# Patient Record
Sex: Female | Born: 1975 | Race: White | Hispanic: No | Marital: Single | Smoking: Former smoker
Health system: Southern US, Community
[De-identification: ages and names within clinical notes are randomized; demographics above are authoritative.]

## PROBLEM LIST (undated history)

## (undated) DIAGNOSIS — B3731 Acute candidiasis of vulva and vagina: Secondary | ICD-10-CM

## (undated) DIAGNOSIS — B373 Candidiasis of vulva and vagina: Secondary | ICD-10-CM

## (undated) HISTORY — PX: NO PAST SURGERIES: SHX2092

---

## 2006-04-14 ENCOUNTER — Emergency Department (HOSPITAL_COMMUNITY): Admission: EM | Admit: 2006-04-14 | Discharge: 2006-04-14 | Payer: Self-pay | Admitting: Family Medicine

## 2007-12-07 ENCOUNTER — Emergency Department (HOSPITAL_COMMUNITY): Admission: EM | Admit: 2007-12-07 | Discharge: 2007-12-07 | Payer: Self-pay | Admitting: Emergency Medicine

## 2010-08-03 ENCOUNTER — Emergency Department (HOSPITAL_COMMUNITY): Admission: EM | Admit: 2010-08-03 | Discharge: 2010-08-03 | Payer: Self-pay | Admitting: Emergency Medicine

## 2011-05-03 ENCOUNTER — Inpatient Hospital Stay (INDEPENDENT_AMBULATORY_CARE_PROVIDER_SITE_OTHER)
Admission: RE | Admit: 2011-05-03 | Discharge: 2011-05-03 | Disposition: A | Discharge status: Home / Self Care | Payer: PRIVATE HEALTH INSURANCE | Source: Ambulatory Visit | Attending: Family Medicine | Admitting: Family Medicine

## 2011-05-03 DIAGNOSIS — J019 Acute sinusitis, unspecified: Secondary | ICD-10-CM

## 2012-01-12 ENCOUNTER — Ambulatory Visit (INDEPENDENT_AMBULATORY_CARE_PROVIDER_SITE_OTHER): Payer: PRIVATE HEALTH INSURANCE

## 2012-01-12 DIAGNOSIS — J019 Acute sinusitis, unspecified: Secondary | ICD-10-CM

## 2012-01-12 DIAGNOSIS — R509 Fever, unspecified: Secondary | ICD-10-CM

## 2012-01-20 ENCOUNTER — Emergency Department (INDEPENDENT_AMBULATORY_CARE_PROVIDER_SITE_OTHER)
Admission: EM | Admit: 2012-01-20 | Discharge: 2012-01-20 | Disposition: A | Discharge status: Home / Self Care | Payer: PRIVATE HEALTH INSURANCE | Source: Home / Self Care | Attending: Emergency Medicine | Admitting: Emergency Medicine

## 2012-01-20 ENCOUNTER — Ambulatory Visit: Payer: PRIVATE HEALTH INSURANCE

## 2012-01-20 ENCOUNTER — Encounter (HOSPITAL_COMMUNITY): Payer: Self-pay | Admitting: *Deleted

## 2012-01-20 DIAGNOSIS — J039 Acute tonsillitis, unspecified: Secondary | ICD-10-CM

## 2012-01-20 LAB — POCT RAPID STREP A: Streptococcus, Group A Screen (Direct): NEGATIVE

## 2012-01-20 MED ORDER — PENICILLIN G BENZATHINE 1200000 UNIT/2ML IM SUSP
1.2000 10*6.[IU] | Freq: Once | INTRAMUSCULAR | Status: AC
  Administered 2012-01-20: 1.2 10*6.[IU] via INTRAMUSCULAR

## 2012-01-20 MED ORDER — PENICILLIN G BENZATHINE 1200000 UNIT/2ML IM SUSP
INTRAMUSCULAR | Status: AC
  Filled 2012-01-20: qty 2

## 2012-01-20 NOTE — ED Notes (Signed)
PT  REPORTS  SYMPTOMS  OF  SORETHROAT  AS  WELL  AS  SENSATION OF  SWELLING    -   SHE     WAS  RECENTLY  SEEN  FOR  POSSIBLE  SINUS  INFECTION AND  IS  TAKING  CEFDINIR    SHE  APPEARS  IN NO  SEVERE  DISTRESS  AND  IS   SPEAKING IN COMPLETE  SENTANCES

## 2012-01-20 NOTE — ED Provider Notes (Signed)
Chief Complaint  Patient presents with  . Sore Throat    History of Present Illness:  The patient is a 36 year old female who has had a two-day history of sore throat, nasal congestion, and a slight dry cough. She had noted some spots of white pus on her right tonsil. She denies any fever, chills, or sweats. She's had no cervical lymphadenopathy, swelling, or pain. She is on a course of Omnicef or sinus infection.  Review of Systems:  Other than noted above, the patient denies any of the following symptoms. Systemic:  No fever, chills, sweats, fatigue, myalgias, headache, or anorexia. Eye:  No redness, pain or drainage. ENT:  No earache, nasal congestion, rhinorrhea, sinus pressure, or sore throat. Lungs:  No cough, sputum production, wheezing, shortness of breath. Or chest pain. GI:  No nausea, vomiting, abdominal pain or diarrhea. Skin:  No rash or itching.  PMFSH:  Past medical history, family history, social history, meds, and allergies were reviewed.  Physical Exam:   Vital signs:  BP 120/62  Pulse 80  Temp(Src) 98.5 F (36.9 C) (Oral)  Resp 18  SpO2 100% General:  Alert, in no distress. Eye:  No conjunctival injection or drainage. ENT:  TMs and canals were normal, without erythema or inflammation.  Nasal mucosa was clear and uncongested, without drainage.  Mucous membranes were moist.  The right tonsil was enlarged, red, and had spots of whitish exudate.  There were no oral ulcerations or lesions. Neck:  Supple, no adenopathy, tenderness or mass. Lungs:  No respiratory distress.  Lungs were clear to auscultation, without wheezes, rales or rhonchi.  Breath sounds were clear and equal bilaterally. Heart:  Regular rhythm, without gallops, murmers or rubs. Skin:  Clear, warm, and dry, without rash or lesions.  Labs:   Results for orders placed during the hospital encounter of 01/20/12  POCT RAPID STREP A ([facility] ONLY)      Component Value Range   Streptococcus, Group A  Screen (Direct) NEGATIVE  NEGATIVE      Radiology:  No results found.  Medications given in UCC:  She was given LA Bicillin 1.2 milliunits IM.  Assessment:   Diagnoses that have been ruled out:  None  Diagnoses that are still under consideration:  None  Final diagnoses:  Tonsillitis      Plan:   1.  The following meds were prescribed:   New Prescriptions   No medications on file   2.  The patient was instructed in symptomatic care and handouts were given. 3.  The patient was told to return if becoming worse in any way, if no better in 3 or 4 days, and given some red flag symptoms that would indicate earlier return.   Roque Lias, MD 01/20/12 [phone]

## 2012-01-27 ENCOUNTER — Emergency Department (INDEPENDENT_AMBULATORY_CARE_PROVIDER_SITE_OTHER)
Admission: EM | Admit: 2012-01-27 | Discharge: 2012-01-27 | Disposition: A | Discharge status: Home / Self Care | Payer: PRIVATE HEALTH INSURANCE | Source: Home / Self Care | Attending: Emergency Medicine | Admitting: Emergency Medicine

## 2012-01-27 ENCOUNTER — Encounter (HOSPITAL_COMMUNITY): Payer: Self-pay | Admitting: Emergency Medicine

## 2012-01-27 DIAGNOSIS — J029 Acute pharyngitis, unspecified: Secondary | ICD-10-CM

## 2012-01-27 HISTORY — DX: Acute candidiasis of vulva and vagina: B37.31

## 2012-01-27 HISTORY — DX: Candidiasis of vulva and vagina: B37.3

## 2012-01-27 MED ORDER — FIRST-DUKES MOUTHWASH MT SUSP: 10.0000 mL | Freq: Four times a day (QID) | OROMUCOSAL | Status: DC | PRN

## 2012-01-27 NOTE — ED Notes (Signed)
PT HERE WITH SORE THROAT AND SWELLING S/P SINUS INFECTION AND ATB TREATMENT.PT WAS SEEN ON 01/20/12 FOR SX AND GIVEN PCN INJECTION WHICH HELPED BUT PT STATES THE PAIN AND SWELLING RESTARTED.NO FEVER,N,V, WITH SX.PT HAS TAKEN IBUPROFEN BUT NOT WORKING.NO S/O DISTRESS OR DIFF SWALLOWING

## 2012-01-27 NOTE — ED Provider Notes (Signed)
History     CSN: [id number]  Arrival date & time 01/27/12  1047   None     Chief Complaint  Patient presents with  . Sore Throat    (Consider location/radiation/quality/duration/timing/severity/associated sxs/prior treatment) HPI Comments: Patient with sore throat, cervical lymphadenopathy starting last night. No nausea, vomiting, fevers, rhinorrhea, voice changes, trismus, cough. No abdominal pain, rash. Patient with similar symptoms approximately a week ago-was seen in the urgent care Center on 01/20/2012 for sore throat, was on Omnicef for sinus infection at that time. Was noted to have enlarged right tonsil with exudate. Rapid strep was negative. Got a shot of Bicillin. Patient states she got completely better, however, symptoms started again last night. Patient states she gets frequent yeast infections, and has just finished 2 courses of antibiotics, and wonders if this could possibly be a yeast infection of her throat.  ROS as noted in HPI. All other ROS negative.   Patient is a 36 y.o. female presenting with pharyngitis. The history is provided by the patient. No language interpreter was used.  Sore Throat This is a recurrent problem. The current episode started yesterday. The problem occurs constantly. Pertinent negatives include no chest pain, no abdominal pain, no headaches and no shortness of breath. The symptoms are aggravated by swallowing. The symptoms are relieved by nothing. She has tried nothing for the symptoms.    Past Medical History  Diagnosis Date  . Yeast vaginitis     History reviewed. No pertinent past surgical history.  History reviewed. No pertinent family history.  History  Substance Use Topics  . Smoking status: Never Smoker   . Smokeless tobacco: Not on file  . Alcohol Use: Yes    OB History    Grav Para Term Preterm Abortions TAB SAB Ect Mult Living                  Review of Systems  Respiratory: Negative for shortness of breath.     Cardiovascular: Negative for chest pain.  Gastrointestinal: Negative for abdominal pain.  Neurological: Negative for headaches.    Allergies  Review of patient's allergies indicates no known allergies.  Home Medications   Current Outpatient Rx  Name Route Sig Dispense Refill  . FIRST-DUKES MOUTHWASH MT SUSP Mouth/Throat Use as directed 10 mLs in the mouth or throat 4 (four) times daily as needed. Swish and swallow x 1 week 237 mL 0    BP 121/78  Pulse 60  Temp(Src) 97.4 F (36.3 C) (Oral)  Resp 16  SpO2 100%  LMP 01/27/2012  Physical Exam  Nursing note and vitals reviewed. Constitutional: She is oriented to person, place, and time. She appears well-developed and well-nourished.  HENT:  Head: Normocephalic and atraumatic.  Right Ear: Tympanic membrane and ear canal normal.  Left Ear: Tympanic membrane and ear canal normal.  Nose: Nose normal.  Mouth/Throat: Uvula is midline and mucous membranes are normal. Posterior oropharyngeal erythema present. No oropharyngeal exudate.       Mildly enlarged tonsils bilaterally. Scant exudates.  Eyes: Conjunctivae and EOM are normal. Pupils are equal, round, and reactive to light.  Neck: Normal range of motion. Neck supple.  Cardiovascular: Normal rate, regular rhythm and normal heart sounds.   Pulmonary/Chest: Effort normal and breath sounds normal.  Abdominal: Soft. Bowel sounds are normal. She exhibits no distension. There is no splenomegaly. There is no tenderness. There is no rebound and no guarding.  Musculoskeletal: Normal range of motion.  Lymphadenopathy:    She has  cervical adenopathy.  Neurological: She is alert and oriented to person, place, and time.  Skin: Skin is warm and dry. No rash noted.  Psychiatric: She has a normal mood and affect. Her behavior is normal. Judgment and thought content normal.    ED Course  Procedures (including critical care time)   Labs Reviewed  POCT RAPID STREP A ([facility] ONLY)  POCT  INFECTIOUS MONO SCREEN  STREP A DNA PROBE   No results found.   1. Pharyngitis       MDM   previous charts, labs reviewed. As noted in history of present illness  Patient's pharynx erythematous, irritated. Mildly enlarged tonsils with exudates. Positive cervical lymphadenopathy. Patient refused mono screen. Strep negative. Will send off throat culture to guide antibiotic treatment if needed. Given the history of 2 courses of antibiotics, may have yeast esophagitis. No oral thrush apparent on exam. Will treat symptoms with Dukes Magic mouthwash. Patient's to followup with PMD of choice prn.  Luiz Blare, MD 01/27/12 [phone]

## 2012-01-28 LAB — STREP A DNA PROBE: Special Requests: NORMAL

## 2013-07-31 LAB — OB RESULTS CONSOLE ANTIBODY SCREEN: Antibody Screen: NEGATIVE

## 2013-07-31 LAB — OB RESULTS CONSOLE HIV ANTIBODY (ROUTINE TESTING): HIV: NONREACTIVE

## 2013-07-31 LAB — OB RESULTS CONSOLE ABO/RH: "RH Type ": POSITIVE

## 2013-07-31 LAB — OB RESULTS CONSOLE RPR: RPR: NONREACTIVE

## 2013-07-31 LAB — OB RESULTS CONSOLE HEPATITIS B SURFACE ANTIGEN: Hepatitis B Surface Ag: NEGATIVE

## 2013-07-31 LAB — OB RESULTS CONSOLE RUBELLA ANTIBODY, IGM: Rubella: NON-IMMUNE/NOT IMMUNE

## 2013-11-19 LAB — OB RESULTS CONSOLE GC/CHLAMYDIA
CHLAMYDIA, DNA PROBE: NEGATIVE
GC PROBE AMP, GENITAL: NEGATIVE

## 2013-11-19 LAB — OB RESULTS CONSOLE GBS: GBS: NEGATIVE

## 2013-12-20 NOTE — L&D Delivery Note (Signed)
Operative Delivery Note At 5:41 AM a viable female was delivered via Vaginal, Spontaneous Delivery.  Presentation: vertex; Position: Occiput,, Anterior; Station: +5. No nuchal cord.  Shoulder dystocia noted upon delivery of head.  Infant delivered in waterbirth tub.  Cord doubly clamped, cut and infant to warmer immediately.  Patient then moved to bed for delivery of placenta.    Delivery of the head:  First maneuver: 12/23/2013  5:38 AM, McRoberts Second maneuver: 12/23/2013  5:38 AM, Suprapubic Pressure Third maneuver: 12/23/2013  5:39 AM, Antonieta PertGaskin (hands/knees) Woods screw (fetal shoulder rotation) Fourth maneuver: 12/23/2013  5:39 AM, Antonieta PertGaskin (hands/knees) Fifth maneuver: 12/23/2013  5:40 AM, Antonieta PertGaskin (hands/knees)Woods screw (fetal shoulder rotation) Sixth maneuver: ,    Verbal consent: obtained from patient.  APGAR: 2, 6, 8; weight 8# 11.3oz.   Placenta status: Intact, Spontaneous.   Cord: 3 vessels with the following complications: Knot.  Cord pH: 7.15   Anesthesia: Local  Episiotomy: None Lacerations: 2nd degree;Perineal Suture Repair: 3.0 monocryl Est. Blood Loss (mL): 400  Mom to postpartum.  Baby to NICU. Infant moving both upper extremities prior to leaving labor room.   Shoulder dystocia discussed with patient and family members.    Huey Scalia O. 12/23/2013, 7:22 AM

## 2013-12-22 ENCOUNTER — Inpatient Hospital Stay (HOSPITAL_COMMUNITY)
Admission: AD | Admit: 2013-12-22 | Discharge: 2013-12-25 | DRG: 775 | Disposition: A | Discharge status: Home / Self Care | Payer: Medicaid Other | Source: Ambulatory Visit | Attending: Obstetrics and Gynecology | Admitting: Obstetrics and Gynecology

## 2013-12-22 ENCOUNTER — Encounter (HOSPITAL_COMMUNITY): Payer: Self-pay

## 2013-12-22 DIAGNOSIS — O4290 Premature rupture of membranes, unspecified as to length of time between rupture and onset of labor, unspecified weeks of gestation: Secondary | ICD-10-CM | POA: Diagnosis present

## 2013-12-22 DIAGNOSIS — IMO0002 Reserved for concepts with insufficient information to code with codable children: Secondary | ICD-10-CM | POA: Diagnosis not present

## 2013-12-22 LAB — CBC
HEMATOCRIT: 35.2 % — AB (ref 36.0–46.0)
HEMOGLOBIN: 12.2 g/dL (ref 12.0–15.0)
MCH: 29.5 pg (ref 26.0–34.0)
MCHC: 34.7 g/dL (ref 30.0–36.0)
MCV: 85.2 fL (ref 78.0–100.0)
Platelets: 296 10*3/uL (ref 150–400)
RBC: 4.13 MIL/uL (ref 3.87–5.11)
RDW: 13.4 % (ref 11.5–15.5)
WBC: 10.1 10*3/uL (ref 4.0–10.5)

## 2013-12-22 LAB — AMNISURE RUPTURE OF MEMBRANE (ROM) NOT AT ARMC: AMNISURE: POSITIVE

## 2013-12-22 LAB — POCT FERN TEST: POCT Fern Test: NEGATIVE

## 2013-12-22 MED ORDER — OXYCODONE-ACETAMINOPHEN 5-325 MG PO TABS
1.0000 | ORAL_TABLET | ORAL | Status: DC | PRN
  Administered 2013-12-23: 2 via ORAL
  Filled 2013-12-22: qty 2

## 2013-12-22 MED ORDER — SODIUM CHLORIDE 0.9 % IV SOLN
3.0000 g | Freq: Four times a day (QID) | INTRAVENOUS | Status: DC
  Filled 2013-12-22 (×4): qty 3

## 2013-12-22 MED ORDER — ONDANSETRON HCL 4 MG/2ML IJ SOLN: 4.0000 mg | Freq: Four times a day (QID) | INTRAMUSCULAR | Status: DC | PRN

## 2013-12-22 MED ORDER — OXYTOCIN 40 UNITS IN LACTATED RINGERS INFUSION - SIMPLE MED
1.0000 m[IU]/min | INTRAVENOUS | Status: DC
Start: 2013-12-22 — End: 2013-12-23
  Administered 2013-12-22: 1 m[IU]/min via INTRAVENOUS
  Filled 2013-12-22: qty 1000

## 2013-12-22 MED ORDER — ACETAMINOPHEN 325 MG PO TABS: 650.0000 mg | ORAL_TABLET | ORAL | Status: DC | PRN

## 2013-12-22 MED ORDER — LACTATED RINGERS IV SOLN: 500.0000 mL | INTRAVENOUS | Status: DC | PRN

## 2013-12-22 MED ORDER — CITRIC ACID-SODIUM CITRATE 334-500 MG/5ML PO SOLN: 30.0000 mL | ORAL | Status: DC | PRN

## 2013-12-22 MED ORDER — OXYTOCIN 40 UNITS IN LACTATED RINGERS INFUSION - SIMPLE MED: 62.5000 mL/h | INTRAVENOUS | Status: DC

## 2013-12-22 MED ORDER — FLEET ENEMA 7-19 GM/118ML RE ENEM: 1.0000 | ENEMA | RECTAL | Status: DC | PRN

## 2013-12-22 MED ORDER — TERBUTALINE SULFATE 1 MG/ML IJ SOLN: 0.2500 mg | Freq: Once | INTRAMUSCULAR | Status: AC | PRN

## 2013-12-22 MED ORDER — FLUOXETINE HCL 20 MG PO CAPS
20.0000 mg | ORAL_CAPSULE | Freq: Every day | ORAL | Status: DC
  Administered 2013-12-22: 20 mg via ORAL
  Filled 2013-12-22 (×2): qty 1

## 2013-12-22 MED ORDER — OXYTOCIN BOLUS FROM INFUSION: 500.0000 mL | INTRAVENOUS | Status: DC

## 2013-12-22 MED ORDER — FENTANYL CITRATE 0.05 MG/ML IJ SOLN: 100.0000 ug | INTRAMUSCULAR | Status: DC | PRN

## 2013-12-22 MED ORDER — LACTATED RINGERS IV SOLN
INTRAVENOUS | Status: DC
  Administered 2013-12-22 – 2013-12-23 (×2): via INTRAVENOUS

## 2013-12-22 MED ORDER — LIDOCAINE HCL (PF) 1 % IJ SOLN
30.0000 mL | INTRAMUSCULAR | Status: AC | PRN
  Administered 2013-12-23: 30 mL via SUBCUTANEOUS
  Filled 2013-12-22 (×2): qty 30

## 2013-12-22 MED ORDER — IBUPROFEN 600 MG PO TABS
600.0000 mg | ORAL_TABLET | Freq: Four times a day (QID) | ORAL | Status: DC | PRN
  Administered 2013-12-23: 600 mg via ORAL
  Filled 2013-12-22: qty 1

## 2013-12-22 MED ORDER — ZOLPIDEM TARTRATE 5 MG PO TABS: 5.0000 mg | ORAL_TABLET | Freq: Every evening | ORAL | Status: DC | PRN

## 2013-12-22 NOTE — Plan of Care (Signed)
Problem: Consults Goal: Birthing Suites Patient Information Press F2 to bring up selections list Outcome: Completed/Met Date Met:  12/22/13  Pt > [redacted] weeks EGA  Comments:  41.1 weeks

## 2013-12-22 NOTE — H&P (Signed)
Joann Lopez is a 38 y.o. female at 8173w1d presenting for SROM 12/22/13 at approx 0200. Maternal Medical History:  Fetal activity: Perceived fetal activity is normal.   Last perceived fetal movement was within the past hour.    Prenatal complications: no prenatal complications Prenatal Complications - Diabetes: none.    Pt presents for admission due to SROM 12/22/13 @ approx 0200.  Reports intermittent gushes of clear fluid since that time.  Reports no painful contractions.  Denies vag bldg and reports normal fetal activity.  Pt plans waterbirth and has attended waterbirth class.  Desires non-interventive birth and plans to employ hypnobirthing.  Accompanied by FOB as well as her sister and a friend who is acting as her doula.    OB History   Grav Para Term Preterm Abortions TAB SAB Ect Mult Living   1              Past Medical History  Diagnosis Date  . Yeast vaginitis    Past Surgical History  Procedure Laterality Date  . No past surgeries     Family History: family history is not on file. Social History:  reports that she quit smoking about 7 months ago. Her smoking use included Cigarettes. She smoked 0.50 packs per day. She has never used smokeless tobacco. She reports that she does not drink alcohol or use illicit drugs.  Hx Present Pregnancy: Transferred to [facility] at [facility] from [facility] in [place].  [facility]ially [facility] care at [facility] and then [facility] due to insurance.  Early ultrasound with evidence of possible vanishing twin.  Anatomy ultrasound with resolution of possible vanishing twin and no anatomic abnormalities identified.  Pt with hx of depression and contd on Prozac throughout pregnancy without any increased symptoms or changes in medications in pregnancy.  Pt with normal 1hr GTT and received TDAP as well as influenza vaccine.  Pt attended waterbirth class.  Prenatal Transfer Tool  Maternal Diabetes: No Genetic Screening:  Declined Maternal Ultrasounds/Referrals: Normal Fetal Ultrasounds or other Referrals:  None Maternal Substance Abuse:  No Significant Maternal Medications:  Meds include: Prozac Significant Maternal Lab Results:  None Other Comments:  None  Review of Systems  Constitutional: Negative.   HENT: Negative.   Eyes: Negative.   Respiratory: Negative.   Cardiovascular: Negative.   Gastrointestinal: Negative.   Genitourinary: Negative.   Musculoskeletal: Negative.   Skin: Negative.   Neurological: Negative.   Endo/Heme/Allergies: Negative.   Psychiatric/Behavioral: Negative.     Dilation: 2.5 Effacement (%): 50 Station: Ballotable Exam by:: Dellie BurnsScarlett Murray, RN BSN (Slide for fern test obtained prior to VE) Blood pressure 133/84, pulse 76, resp. rate 18, height 5\' 7"  (1.702 m), weight 108.773 kg (239 lb 12.8 oz), SpO2 97.00%. Maternal Exam:  Uterine Assessment: Contraction strength is mild.  Contraction frequency is irregular.   Abdomen: Patient reports no abdominal tenderness. Fundal height is 40.   Estimated fetal weight is 7.5#.   Fetal presentation: vertex  Introitus: Normal vulva. Normal vagina.  Ferning test: positive.  Nitrazine test: not done. Amniotic fluid character: clear.  Pelvis: adequate for delivery.   Cervix: Cervix evaluated by sterile speculum exam and digital exam.     Fetal Exam Fetal Monitor Review: Mode: ultrasound.   Baseline rate: 135.  Variability: moderate (6-25 bpm).   Pattern: no decelerations and accelerations present.    Fetal State Assessment: Category I - tracings are normal.     Physical Exam  Constitutional: She is oriented to person,  place, and time. She appears well-developed and well-nourished.  HENT:  Head: Normocephalic and atraumatic.  Right Ear: External ear normal.  Left Ear: External ear normal.  Nose: Nose normal.  Eyes: Conjunctivae are normal. Pupils are equal, round, and reactive to light.  Neck: Normal range of  motion. Neck supple.  Cardiovascular: Normal rate, regular rhythm and intact distal pulses.   Respiratory: Effort normal and breath sounds normal.  GI: Soft. Bowel sounds are normal.  Genitourinary: Vagina normal and uterus normal.  Perineum wet with evidence of SROM.  Positive pooling noted.  Sm amt clear fluid and mucus noted in vault.  Musculoskeletal: Normal range of motion.  Neurological: She is alert and oriented to person, place, and time. She has normal reflexes.  Skin: Skin is warm and dry.  Psychiatric: She has a normal mood and affect. Her behavior is normal.    Prenatal labs: ABO, Rh: O/Positive/-- (08/12 0000) Antibody: Negative (08/12 0000) Rubella: Nonimmune (08/12 0000) RPR: Nonreactive (08/12 0000)  HBsAg: Negative (08/12 0000)  HIV: Non-reactive (08/12 0000)  GBS: Negative (12/01 0000)   Assessment/Plan: IUP at 41w 1d PROM x 18hrs  Admit to [facility] per consult with Dr. Normand Sloop. Per Dr. Normand Sloop, begin Unasyn 3gm q6hr due to prolonged ROM.  Pt refuses unless she develops evidence of chorioamnionitis. RBA pitocin augmentation d/w pt due to prolonged ROM and no active labor. Pt agrees to begin pitocin. Desires to employ hypnobirthing and desires waterbirth.  D/W pt need for continuous EFM and agrees.   Will limit vaginal exams unless indicated.    Kenry Daubert O. 12/22/2013, 8:21 PM

## 2013-12-22 NOTE — Progress Notes (Signed)
Joann Lopez is a 38 y.o. G1P0 at 3495w1d admitted for rupture of membranes  Subjective: [facility]  Reports some mild cramping with contractions but no pain.  No bldg.  Continues to leak clear fluid.  Active fetus.    Objective: BP 157/89  Pulse 79  Temp(Src) 98.6 F (37 C) (Oral)  Resp 20  Ht 5\' 7"  (1.702 m)  Wt 108.773 kg (239 lb 12.8 oz)  BMI 37.55 kg/m2  SpO2 97%  FHT:  FHR: 135 bpm, variability: moderate,  accelerations:  Present,  decelerations:  Absent UC:   regular, every 1-4 minutes SVE:   Dilation: 3 Effacement (%): 70 Station: -3 Exam by:: Elsie RaNona Zacari Stiff, CNM  Labs: Lab Results  Component Value Date   WBC 10.1 12/22/2013   HGB 12.2 12/22/2013   HCT 35.2* 12/22/2013   MCV 85.2 12/22/2013   PLT 296 12/22/2013    Assessment / Plan: Induction of labor due to PROM at 41w 1d Prolonged ROM x 18hrs  Labor: No labor at present Preeclampsia:  no signs or symptoms of toxicity Fetal Wellbeing:  Category I Pain Control:  Labor support without medications I/D:  Afebrile Anticipated MOD:  NSVD  Again d/w pt antibiotics due to prolonged ROM and pt again declines.   RBA pitocin augmentation d/w pt and she desires to proceed.  Will begin Pitocin per protocol.   Markham Dumlao O. 12/22/2013, 10:00 PM

## 2013-12-22 NOTE — MAU Note (Signed)
Patient states she started leaking clear fluid at 0200 and has continued to have leaking off and on. Denies contractions. States good fetal movement.

## 2013-12-23 ENCOUNTER — Encounter (HOSPITAL_COMMUNITY): Payer: Self-pay | Admitting: *Deleted

## 2013-12-23 DIAGNOSIS — IMO0002 Reserved for concepts with insufficient information to code with codable children: Secondary | ICD-10-CM | POA: Diagnosis not present

## 2013-12-23 LAB — COMPREHENSIVE METABOLIC PANEL
ALT: 27 U/L (ref 0–35)
AST: 34 U/L (ref 0–37)
Albumin: 2.9 g/dL — ABNORMAL LOW (ref 3.5–5.2)
Alkaline Phosphatase: 124 U/L — ABNORMAL HIGH (ref 39–117)
BUN: 11 mg/dL (ref 6–23)
CO2: 21 mEq/L (ref 19–32)
CREATININE: 0.56 mg/dL (ref 0.50–1.10)
Calcium: 9.1 mg/dL (ref 8.4–10.5)
Chloride: 102 mEq/L (ref 96–112)
GFR calc Af Amer: >90 mL/min (ref >90)
GFR calc non Af Amer: >90 mL/min (ref >90)
Glucose, Bld: 107 mg/dL — ABNORMAL HIGH (ref 70–99)
Potassium: 4.2 mEq/L (ref 3.7–5.3)
Sodium: 139 mEq/L (ref 137–147)
Total Protein: 6.3 g/dL (ref 6.0–8.3)

## 2013-12-23 LAB — ABO/RH: ABO/RH(D): O POS

## 2013-12-23 LAB — URIC ACID: Uric Acid, Serum: 5 mg/dL (ref 2.4–7.0)

## 2013-12-23 LAB — LACTATE DEHYDROGENASE: LDH: 169 U/L (ref 94–250)

## 2013-12-23 LAB — RPR: RPR Ser Ql: NONREACTIVE

## 2013-12-23 MED ORDER — PRENATAL MULTIVITAMIN CH
1.0000 | ORAL_TABLET | Freq: Every day | ORAL | Status: DC
  Administered 2013-12-23 – 2013-12-25 (×3): 1 via ORAL
  Filled 2013-12-23 (×3): qty 1

## 2013-12-23 MED ORDER — ONDANSETRON HCL 4 MG/2ML IJ SOLN: 4.0000 mg | INTRAMUSCULAR | Status: DC | PRN

## 2013-12-23 MED ORDER — TETANUS-DIPHTH-ACELL PERTUSSIS 5-2.5-18.5 LF-MCG/0.5 IM SUSP: 0.5000 mL | Freq: Once | INTRAMUSCULAR | Status: DC

## 2013-12-23 MED ORDER — IBUPROFEN 600 MG PO TABS
600.0000 mg | ORAL_TABLET | Freq: Four times a day (QID) | ORAL | Status: DC
  Administered 2013-12-23 – 2013-12-25 (×7): 600 mg via ORAL
  Filled 2013-12-23 (×9): qty 1

## 2013-12-23 MED ORDER — SIMETHICONE 80 MG PO CHEW: 80.0000 mg | CHEWABLE_TABLET | ORAL | Status: DC | PRN

## 2013-12-23 MED ORDER — DIBUCAINE 1 % RE OINT: 1.0000 "application " | TOPICAL_OINTMENT | RECTAL | Status: DC | PRN

## 2013-12-23 MED ORDER — SENNOSIDES-DOCUSATE SODIUM 8.6-50 MG PO TABS
2.0000 | ORAL_TABLET | ORAL | Status: DC
Start: 2013-12-24 — End: 2013-12-25
  Administered 2013-12-23 – 2013-12-25 (×2): 2 via ORAL
  Filled 2013-12-23 (×2): qty 2

## 2013-12-23 MED ORDER — WITCH HAZEL-GLYCERIN EX PADS: 1.0000 "application " | MEDICATED_PAD | CUTANEOUS | Status: DC | PRN

## 2013-12-23 MED ORDER — OXYTOCIN 10 UNIT/ML IJ SOLN
INTRAMUSCULAR | Status: AC
  Administered 2013-12-23: 10 [IU] via INTRAMUSCULAR
  Filled 2013-12-23: qty 1

## 2013-12-23 MED ORDER — BENZOCAINE-MENTHOL 20-0.5 % EX AERO
1.0000 "application " | INHALATION_SPRAY | CUTANEOUS | Status: DC | PRN
  Filled 2013-12-23 (×3): qty 56

## 2013-12-23 MED ORDER — ZOLPIDEM TARTRATE 5 MG PO TABS
5.0000 mg | ORAL_TABLET | Freq: Every evening | ORAL | Status: DC | PRN
  Administered 2013-12-23: 5 mg via ORAL
  Filled 2013-12-23: qty 1

## 2013-12-23 MED ORDER — ONDANSETRON HCL 4 MG PO TABS: 4.0000 mg | ORAL_TABLET | ORAL | Status: DC | PRN

## 2013-12-23 MED ORDER — DIPHENHYDRAMINE HCL 25 MG PO CAPS: 25.0000 mg | ORAL_CAPSULE | Freq: Four times a day (QID) | ORAL | Status: DC | PRN

## 2013-12-23 MED ORDER — OXYCODONE-ACETAMINOPHEN 5-325 MG PO TABS
1.0000 | ORAL_TABLET | ORAL | Status: DC | PRN
  Administered 2013-12-24: 1 via ORAL
  Filled 2013-12-23: qty 1

## 2013-12-23 MED ORDER — LANOLIN HYDROUS EX OINT: TOPICAL_OINTMENT | CUTANEOUS | Status: DC | PRN

## 2013-12-24 ENCOUNTER — Encounter (HOSPITAL_COMMUNITY): Payer: Self-pay | Admitting: *Deleted

## 2013-12-24 LAB — CBC
HCT: 27.6 % — ABNORMAL LOW (ref 36.0–46.0)
Hemoglobin: 9.4 g/dL — ABNORMAL LOW (ref 12.0–15.0)
MCH: 29.3 pg (ref 26.0–34.0)
MCHC: 34.1 g/dL (ref 30.0–36.0)
MCV: 86 fL (ref 78.0–100.0)
PLATELETS: 195 10*3/uL (ref 150–400)
RBC: 3.21 MIL/uL — ABNORMAL LOW (ref 3.87–5.11)
RDW: 13.5 % (ref 11.5–15.5)
WBC: 10.1 10*3/uL (ref 4.0–10.5)

## 2013-12-24 NOTE — Lactation Note (Signed)
This note was copied from the chart of Friona. Lactation Consultation Note    Initial consult with this mom of a term baby, born with shoulder dystocia, and apgars 2/6/8. Mom had a DEP set up for her yesterday- she  Expressed 1-2 mls of colostrum every 3 hours. . I met her in the nICU this morning, to assist with baby's first breast feeding. In cross cradle, I showed mom how to compress her tissue, and latch baby. He latches well, with strong suckles and swallows. Mom has easily expressed drops of colostrum. The plan is for her to go to the NICU when she is called that [facility] if awake and cuing. I advised mom to continue pumping when she is in her room, to provide EBM as supplement for her baby, and to avoid formula, if possible. I will follow up with this mom nad baby tomrrow.    Patient Name: Joann Lopez JOACZ'Y Date: 12/24/2013     Maternal Data    Feeding Feeding Type: Breast Fed  LATCH Score/Interventions Latch: Repeated attempts needed to sustain latch, nipple held in mouth throughout feeding, stimulation needed to elicit sucking reflex. Intervention(s): Adjust position;Assist with latch  Audible Swallowing: Spontaneous and intermittent  Type of Nipple: Everted at rest and after stimulation  Comfort (Breast/Nipple): Soft / non-tender     Hold (Positioning): Assistance needed to correctly position infant at breast and maintain latch. Intervention(s): Support Pillows;Position options  LATCH Score: 8  Lactation Tools Discussed/Used     Consult Status      Tonna Corner 12/24/2013, 5:56 PM

## 2013-12-24 NOTE — Progress Notes (Signed)
Post Partum Day 1 Subjective:  Well. Lochia are normal. Voiding, ambulating, tolerating normal diet. nursing going well. Baby doing well in NICU: awaiting final culture results before D/C ATB...72 hours  Objective: Blood pressure 114/66, pulse 74, temperature 97.7 F (36.5 C), temperature source Oral, resp. rate 18, height 5\' 7"  (1.702 m), weight 239 lb 12.8 oz (108.773 kg), SpO2 100.00%, unknown if currently breastfeeding.  Physical Exam:  General: normal Lochia: appropriate Uterine Fundus: 0/1 firm non-tender  Extremities: No evidence of DVT seen on physical exam. Edema 1+     Recent Labs  12/22/13 2035 12/24/13 0515  HGB 12.2 9.4*  HCT 35.2* 27.6*    Assessment/Plan: Normal Post-partum. Continue routine post-partum care. Anticipate discharge tomorrow with rooming-in?    LOS: 2 days   Arael Piccione A MD 12/24/2013, 12:58 PM

## 2013-12-25 ENCOUNTER — Ambulatory Visit: Payer: Self-pay

## 2013-12-25 MED ORDER — MEASLES, MUMPS & RUBELLA VAC ~~LOC~~ INJ
0.5000 mL | INJECTION | Freq: Once | SUBCUTANEOUS | Status: AC
  Administered 2013-12-25: 0.5 mL via SUBCUTANEOUS
  Filled 2013-12-25: qty 0.5

## 2013-12-25 MED ORDER — OXYCODONE-ACETAMINOPHEN 5-325 MG PO TABS: 1.0000 | ORAL_TABLET | Freq: Four times a day (QID) | ORAL | Status: AC | PRN

## 2013-12-25 MED ORDER — IBUPROFEN 600 MG PO TABS: 600.0000 mg | ORAL_TABLET | Freq: Four times a day (QID) | ORAL | Status: AC | PRN

## 2013-12-25 NOTE — Discharge Summary (Signed)
Obstetric Discharge Summary Reason for Admission: onset of labor Prenatal Procedures: ultrasound Intrapartum Procedures: spontaneous vaginal delivery (water birth) with shoulder dystocia baby went to NICU but doing well Postpartum Procedures: none Complications-Operative and Postpartum: none Hemoglobin  Date Value Range Status  12/24/2013 9.4* 12.0 - 15.0 g/dL Final     DELTA CHECK NOTED     REPEATED TO VERIFY     HCT  Date Value Range Status  12/24/2013 27.6* 36.0 - 46.0 % Final   Breast feeding.  Plans Mirena. Baby staying at least until tomorrow.  Mom may room in.  Physical Exam:  General: alert and no distress Lochia: appropriate Uterine Fundus: firm DVT Evaluation: No evidence of DVT seen on physical exam.  Discharge Diagnoses: Term Pregnancy-delivered  Discharge Information: Date: 12/25/2013 Activity: pelvic rest Diet: routine Medications: Ibuprofen and Percocet Condition: stable Instructions: refer to practice specific booklet Discharge to: home   Newborn Data: Live born female  Birth Weight: 8 lb 11.3 oz (3950 g) APGAR: 2, 6  Home with baby in NICU but planning discharge home with mom tomorrow.  Purcell NailsROBERTS,Calven Gilkes Y 12/25/2013, 12:24 PM

## 2013-12-25 NOTE — Lactation Note (Signed)
This note was copied from the chart of Joann Nilda RiggsKatherine Spitler. Lactation Consultation Note     Follow up consult with this mom of a NICU baby, full term, and now 55 hours pot partum. Mom has been exclusviely breast feeding, and is going to stay in a NICU rooming in room tonght, to continue breast feeding. If the baby's blood work shows he needs to stay on antibitics, I will speak to mom about renting a DE. Hopefully, he will get to be idischarged to home , if lab work is good. i reviewed hand expression with mom, and advsed her to use her EBM on her nipples, as opposed to lanolin. Her nipples appear intact, and mom reports breast feeding going well.   Patient Name: Joann Lopez JYNWG'NToday's Date: 12/25/2013 Reason for consult: Follow-up assessment;NICU baby   Maternal Data    Feeding Feeding Type: Breast Fed Length of feed: 20 min  LATCH Score/Interventions Latch: Repeated attempts needed to sustain latch, nipple held in mouth throughout feeding, stimulation needed to elicit sucking reflex. Intervention(s): Skin to skin;Waking techniques Intervention(s): Adjust position  Audible Swallowing: A few with stimulation Intervention(s): Skin to skin  Type of Nipple: Everted at rest and after stimulation  Comfort (Breast/Nipple): Filling, red/small blisters or bruises, mild/mod discomfort  Problem noted: Cracked, bleeding, blisters, bruises  Hold (Positioning): No assistance needed to correctly position infant at breast. Intervention(s): Support Pillows  LATCH Score: 7  Lactation Tools Discussed/Used     Consult Status Consult Status: Follow-up Date: 12/26/13 Follow-up type: In-patient    Alfred LevinsLee, Brytni Dray Anne 12/25/2013, 4:57 PM

## 2013-12-25 NOTE — Progress Notes (Signed)
Pt discharged to home with significant other and mother.  RN walked with patient to NICU to rooming-in room for overnight.  No equipment for home ordered at discharge.

## 2013-12-26 ENCOUNTER — Ambulatory Visit: Payer: Self-pay

## 2013-12-26 NOTE — Lactation Note (Signed)
This note was copied from the chart of Boy Nilda RiggsKatherine Urbanski. Lactation Consultation Note     Brief follow up consult with this mom of a term NICU baby, now 79 hours post partum. Mom has been exclusively breast feeding  The baby. She was adked to supplement with formul today, due to decrease urine output. Mom is aware to call for lactation assistance, once home with baby, as needed.   Patient Name: Boy Nilda RiggsKatherine Bennis ZOXWR'UToday's Date: 12/26/2013 Reason for consult: Follow-up assessment;NICU baby   Maternal Data    Feeding    LATCH Score/Interventions                      Lactation Tools Discussed/Used     Consult Status Consult Status: Follow-up Date: 12/27/13 Follow-up type: In-patient    Alfred LevinsLee, Hobson Lax Anne 12/26/2013, 1:47 PM

## 2014-01-10 NOTE — Progress Notes (Signed)
Joann Lopez is a 38 y.o. 38t 669w2d for induction of labor due to Gestational diabetes.  Subjective: Reports UCs are somewhat stronger.  Having to breath with UCs.  Planning on getting in the birth tub soon.    Objective: BP 127/76  Pulse 89  Temp(Src) 98.2 F (36.8 C) (Oral)  Resp 18  Ht 5\' 7"  (1.702 m)  Wt 108.773 kg (239 lb 12.8 oz)  BMI 37.55 kg/m2  SpO2 100%   FHT:  FHR: 130 bpm, variability: moderate,  accelerations:  Present,  decelerations:  Absent UC:   1-3, mod to palpation, Pitocin at 535mu/min. SVE:  Deferred  Labs: Lab Results  Component Value Date   WBC 10.1 12/24/2013   HGB 9.4* 12/24/2013   HCT 27.6* 12/24/2013   MCV 86.0 12/24/2013   PLT 195 12/24/2013    Assessment / Plan: Induction of labor due to PROM IUP at 41w 3d  Labor: Progressing normally Preeclampsia:  no signs or symptoms of toxicity Fetal Wellbeing:  Category I Pain Control:  Labor support without medications I/D:  Neg GBS/Afebrile Anticipated MOD:  NSVD   Continue current plan of care and continue pitocin.  Pt to continue use of birth tub prn.  Rosendo Couser O. 01/10/2014, 11:04 PM

## 2014-01-10 NOTE — Progress Notes (Signed)
Joann Lopez is a 38 y.o. G1P0000 at 494w2d admitted for induction of labor due to SROM  Subjective: Pt now having to breath with UCs.  Coping well.  Doula, pt's sister, pt's mother and FOB at bedside offering support.    Objective: BP 154/91  Pulse 85  Temp(Src) 98.4 F (36.9 C) (Oral)  Resp 18  Ht 5\' 7"  (1.702 m)  Wt 108.773 kg (239 lb 12.8 oz)  BMI 37.55 kg/m2  SpO2 100%      FHT:  FHR: 135 bpm, variability: moderate,  accelerations:  Present,  decelerations:  Absent UC:   regular, every 1-3 minutes. Pitocin at 65mu/min  SVE:  Deferred at present     Labs: Lab Results  Component Value Date   WBC 10.1 12/24/2013   HGB 9.4* 12/24/2013   HCT 27.6* 12/24/2013   MCV 86.0 12/24/2013   PLT 195 12/24/2013    Assessment / Plan: Induction of labor due to PROM,  progressing well on pitocin  Labor: Progressing normally Preeclampsia:  on magnesium sulfate Fetal Wellbeing:  Category I Pain Control:  Labor support without medications I/D:  n/a Anticipated MOD:  NSVD  Continue current care at present.  Pt to continue to utilize the birth tub prn.    Joann Randa O. 01/10/2014, 11:19 PM

## 2014-01-14 NOTE — Progress Notes (Addendum)
Joann Lopez is a 38 y.Lopez. G1P0 at 8048w2d Subjective: Pt c/Lopez urge to push with contractions.  Has been in and out of birth tub.  Doula, pt's sister and pt's mo at bedside offering support.  Coping well with UCs.    Objective: BP 143/79  Pulse 73  Temp(Src) 98.1 F (36.7 C) (Oral)  Resp 18  Ht 5\' 7"  (1.702 m)  Wt 108.773 kg (239 lb 12.8 oz)  BMI 37.55 kg/m2  SpO2 100%  FHT:  FHR: 130 bpm, variability: moderate,  accelerations:  Present,  decelerations:  Absent UC:   regular, every 1-3 minutes. Pitocin at 335mu/min SVE:   Dilation: 8 Effacement (%): 90 Station: -1 Exam by:: Joann Lopez. Joann Lopez, CNM   Assessment / Plan: IUP at 41w 2d Prolonged ROM  Labor: Progressing normally Preeclampsia:  no signs or symptoms of toxicity Fetal Wellbeing:  Category I Pain Control:  Labor support without medications I/D:  GBS neg/Afebrile Anticipated MOD:  NSVD  Continue expectant management at present.    Joann Lopez. 12/23/13, 7:09 PM

## 2014-05-06 ENCOUNTER — Emergency Department (INDEPENDENT_AMBULATORY_CARE_PROVIDER_SITE_OTHER)
Admission: EM | Admit: 2014-05-06 | Discharge: 2014-05-06 | Disposition: A | Discharge status: Home / Self Care | Payer: Self-pay | Source: Home / Self Care

## 2014-05-06 ENCOUNTER — Encounter (HOSPITAL_COMMUNITY): Payer: Self-pay | Admitting: Emergency Medicine

## 2014-05-06 DIAGNOSIS — J309 Allergic rhinitis, unspecified: Secondary | ICD-10-CM

## 2014-05-06 MED ORDER — GUAIFENESIN-CODEINE 100-10 MG/5ML PO SYRP: ORAL_SOLUTION | ORAL | Status: AC

## 2014-05-06 NOTE — ED Notes (Signed)
Pt  Reports  Symptoms  Of  Cough   -  Congestion          sorethroat   With  Symptoms      X  10  Days           Pt   Reports     Symptoms  Not  releived  By  otc  meds

## 2014-05-06 NOTE — Discharge Instructions (Signed)
Allergic Rhinitis Allegra 180 mg a day Flonase nasal spray Continue saline nasal spray Sudafed PE for congestion  Allergic rhinitis is when the mucous membranes in the nose respond to allergens. Allergens are particles in the air that cause your body to have an allergic reaction. This causes you to release allergic antibodies. Through a chain of events, these eventually cause you to release histamine into the blood stream. Although meant to protect the body, it is this release of histamine that causes your discomfort, such as frequent sneezing, congestion, and an itchy, runny nose.  CAUSES  Seasonal allergic rhinitis (hay fever) is caused by pollen allergens that may come from grasses, trees, and weeds. Year-round allergic rhinitis (perennial allergic rhinitis) is caused by allergens such as house dust mites, pet dander, and mold spores.  SYMPTOMS   Nasal stuffiness (congestion).  Itchy, runny nose with sneezing and tearing of the eyes. DIAGNOSIS  Your health care provider can help you determine the allergen or allergens that trigger your symptoms. If you and your health care provider are unable to determine the allergen, skin or blood testing may be used. TREATMENT  Allergic Rhinitis does not have a cure, but it can be controlled by:  Medicines and allergy shots (immunotherapy).  Avoiding the allergen. Hay fever may often be treated with antihistamines in pill or nasal spray forms. Antihistamines block the effects of histamine. There are over-the-counter medicines that may help with nasal congestion and swelling around the eyes. Check with your health care provider before taking or giving this medicine.  If avoiding the allergen or the medicine prescribed do not work, there are many new medicines your health care provider can prescribe. Stronger medicine may be used if initial measures are ineffective. Desensitizing injections can be used if medicine and avoidance does not work.  Desensitization is when a patient is given ongoing shots until the body becomes less sensitive to the allergen. Make sure you follow up with your health care provider if problems continue. HOME CARE INSTRUCTIONS It is not possible to completely avoid allergens, but you can reduce your symptoms by taking steps to limit your exposure to them. It helps to know exactly what you are allergic to so that you can avoid your specific triggers. SEEK MEDICAL CARE IF:   You have a fever.  You develop a cough that does not stop easily (persistent).  You have shortness of breath.  You start wheezing.  Symptoms interfere with normal daily activities. Document Released: 08/31/2001 Document Revised: 09/26/2013 Document Reviewed: 08/13/2013 [facility] Patient Information 2014 [employer], [place].  Cough, Adult  A cough is a reflex. It helps you clear your throat and airways. A cough can help heal your body. A cough can last 2 or 3 weeks (acute) or may last more than 8 weeks (chronic). Some common causes of a cough can include an infection, allergy, or a cold. HOME CARE  Only take medicine as told by your doctor.  If given, take your medicines (antibiotics) as told. Finish them even if you start to feel better.  Use a cold steam vaporizer or humidier in your home. This can help loosen thick spit (secretions).  Sleep so you are almost sitting up (semi-upright). Use pillows to do this. This helps reduce coughing.  Rest as needed.  Stop smoking if you smoke. GET HELP RIGHT AWAY IF:  You have yellowish-white fluid (pus) in your thick spit.  Your cough gets worse.  Your medicine does not reduce coughing, and you are losing  sleep.  You cough up blood.  You have trouble breathing.  Your pain gets worse and medicine does not help.  You have a fever. MAKE SURE YOU:   Understand these instructions.  Will watch your condition.  Will get help right away if you are not doing well or get  worse. Document Released: 08/19/2011 Document Revised: 02/28/2012 Document Reviewed: 08/19/2011 [facility] Patient Information 2014 [place], [place].

## 2014-05-06 NOTE — ED Provider Notes (Signed)
CSN: [id number]     Arrival date & time 05/06/14  1055 History   First MD Initiated Contact with Patient 05/06/14 1219     Chief Complaint  Patient presents with  . URI   (Consider location/radiation/quality/duration/timing/severity/associated sxs/prior Treatment) HPI Comments: Nasal congestion, cough, sore throat with swelling. No fever.   Past Medical History  Diagnosis Date  . Yeast vaginitis    Past Surgical History  Procedure Laterality Date  . No past surgeries     History reviewed. No pertinent family history. History  Substance Use Topics  . Smoking status: Former Smoker -- 0.50 packs/day    Types: Cigarettes    Quit date: 04/23/2013  . Smokeless tobacco: Never Used  . Alcohol Use: No   OB History   Grav Para Term Preterm Abortions TAB SAB Ect Mult Living   1 1 1       1      Review of Systems  Constitutional: Negative for fever, chills, activity change, appetite change and fatigue.  HENT: Positive for congestion, postnasal drip, rhinorrhea, sinus pressure and sore throat. Negative for facial swelling.   Eyes: Negative.   Respiratory: Positive for cough. Negative for shortness of breath and wheezing.   Cardiovascular: Negative.   Musculoskeletal: Negative for neck pain and neck stiffness.  Skin: Negative for pallor and rash.  Neurological: Negative.     Allergies  Review of patient's allergies indicates no known allergies.  Home Medications   Prior to Admission medications   Medication Sig Start Date End Date Taking? Authorizing Provider  FLUoxetine (PROZAC) 20 MG capsule Take 20 mg by mouth daily.    Historical Provider, MD  ibuprofen (ADVIL,MOTRIN) 600 MG tablet Take 1 tablet (600 mg total) by mouth every 6 (six) hours as needed. 12/25/13   Purcell NailsAngela Y Roberts, MD  oxyCODONE-acetaminophen (PERCOCET/ROXICET) 5-325 MG per tablet Take 1-2 tablets by mouth every 6 (six) hours as needed for severe pain (moderate - severe pain). 12/25/13   Purcell NailsAngela Y Roberts, MD   Prenatal Vit-Fe Fumarate-FA (PRENATAL MULTIVITAMIN) TABS tablet Take 1 tablet by mouth daily at 12 noon.    Historical Provider, MD  ranitidine (ZANTAC) 150 MG tablet Take 150 mg by mouth daily.    Historical Provider, MD   BP 120/64  Pulse 82  Temp(Src) 98.5 F (36.9 C) (Oral)  Resp 18  SpO2 98% Physical Exam  Nursing note and vitals reviewed. Constitutional: She is oriented to person, place, and time. She appears well-developed and well-nourished. No distress.  HENT:  Right Ear: External ear normal.  Left Ear: External ear normal.  Mouth/Throat: No oropharyngeal exudate.  OP injected, clear PND, no swelling.   Eyes: Conjunctivae and EOM are normal.  Neck: Normal range of motion. Neck supple.  Cardiovascular: Normal rate, regular rhythm and normal heart sounds.   Pulmonary/Chest: Effort normal and breath sounds normal. No respiratory distress. She has no wheezes. She has no rales.  Musculoskeletal: Normal range of motion. She exhibits no edema.  Lymphadenopathy:    She has no cervical adenopathy.  Neurological: She is alert and oriented to person, place, and time.  Skin: Skin is warm and dry. No rash noted.  Psychiatric: She has a normal mood and affect.    ED Course  Procedures (including critical care time) Labs Review Labs Reviewed - No data to display  Imaging Review No results found.   MDM   1. Allergic rhinosinusitis     OTC meds discussed Nasal saline and flonase cheratussin AC Fluids  Nursing, use least amt that is beneficial.     Hayden Rasmussenavid Elan Brainerd, NP 05/06/14 1232  Hayden Rasmussenavid Takeshia Wenk, NP 05/06/14 1244

## 2014-05-07 NOTE — ED Provider Notes (Signed)
Medical screening examination/treatment/procedure(s) were performed by non-physician practitioner and as supervising physician I was immediately available for consultation/collaboration.  Leslee Homeavid Trenia Tennyson, M.D.  Reuben Likesavid C Melda Mermelstein, MD 05/07/14 [phone]

## 2014-10-21 ENCOUNTER — Encounter (HOSPITAL_COMMUNITY): Payer: Self-pay | Admitting: Emergency Medicine

## 2021-05-04 ENCOUNTER — Other Ambulatory Visit: Payer: Self-pay | Admitting: Family Medicine

## 2021-05-04 DIAGNOSIS — Z1231 Encounter for screening mammogram for malignant neoplasm of breast: Secondary | ICD-10-CM

## 2021-05-12 ENCOUNTER — Ambulatory Visit
Admission: RE | Admit: 2021-05-12 | Discharge: 2021-05-12 | Disposition: A | Discharge status: Home / Self Care | Payer: PRIVATE HEALTH INSURANCE | Source: Ambulatory Visit | Attending: Family Medicine | Admitting: Family Medicine

## 2021-05-12 ENCOUNTER — Other Ambulatory Visit: Payer: Self-pay

## 2021-05-12 DIAGNOSIS — Z1231 Encounter for screening mammogram for malignant neoplasm of breast: Secondary | ICD-10-CM

## 2022-10-07 IMAGING — MG MM DIGITAL SCREENING BILAT W/ TOMO AND CAD
6 of 10 series · 6 of 30 positions shown · non-contrast
Comparison: Previous exam(s).

CLINICAL DATA: Screening.

EXAM:
DIGITAL SCREENING BILATERAL MAMMOGRAM WITH TOMOSYNTHESIS AND CAD
TECHNIQUE: Bilateral screening digital craniocaudal and mediolateral oblique
mammograms were obtained. Bilateral screening digital breast
tomosynthesis was performed. The images were evaluated with
computer-aided detection.

[R CC synth-2D (1 of 2)]
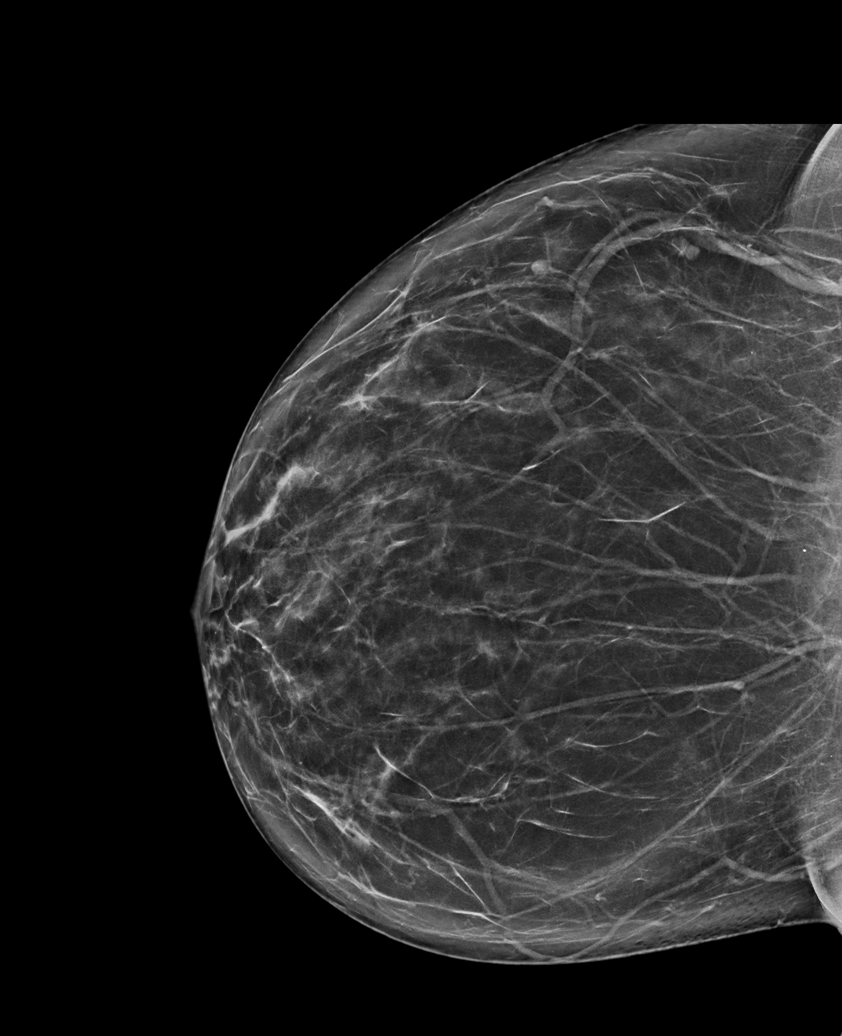

[R MLO synth-2D]
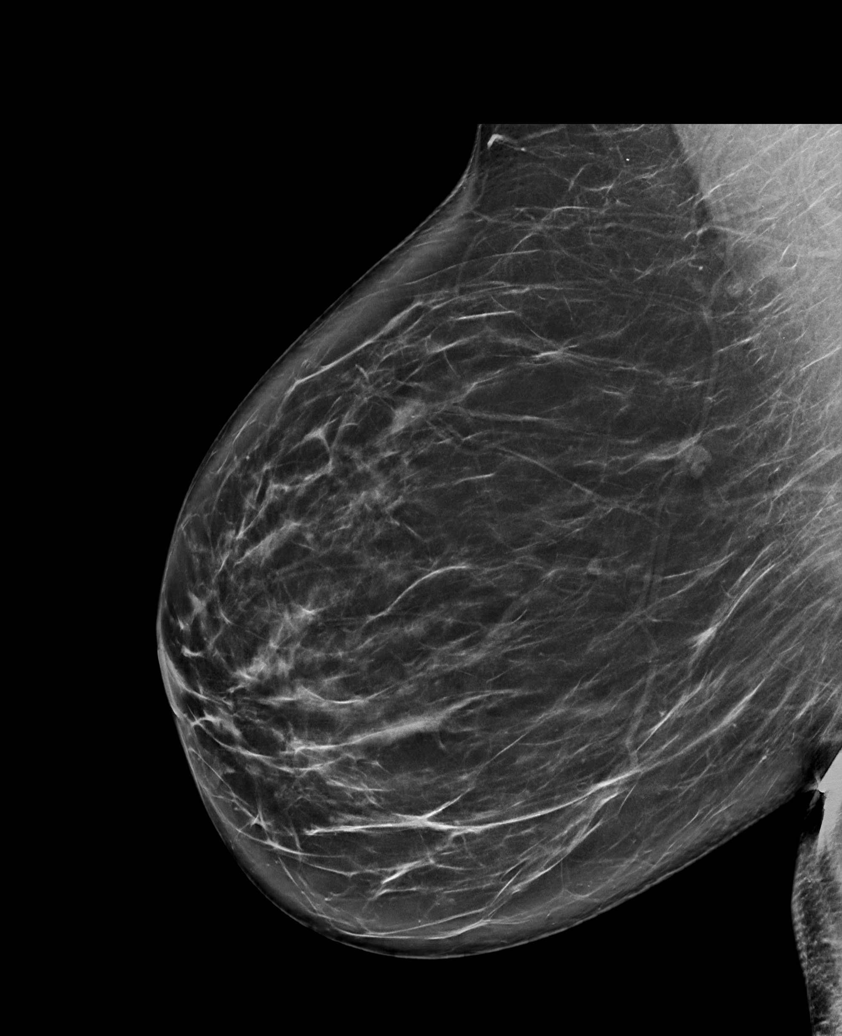

[L CC synth-2D]
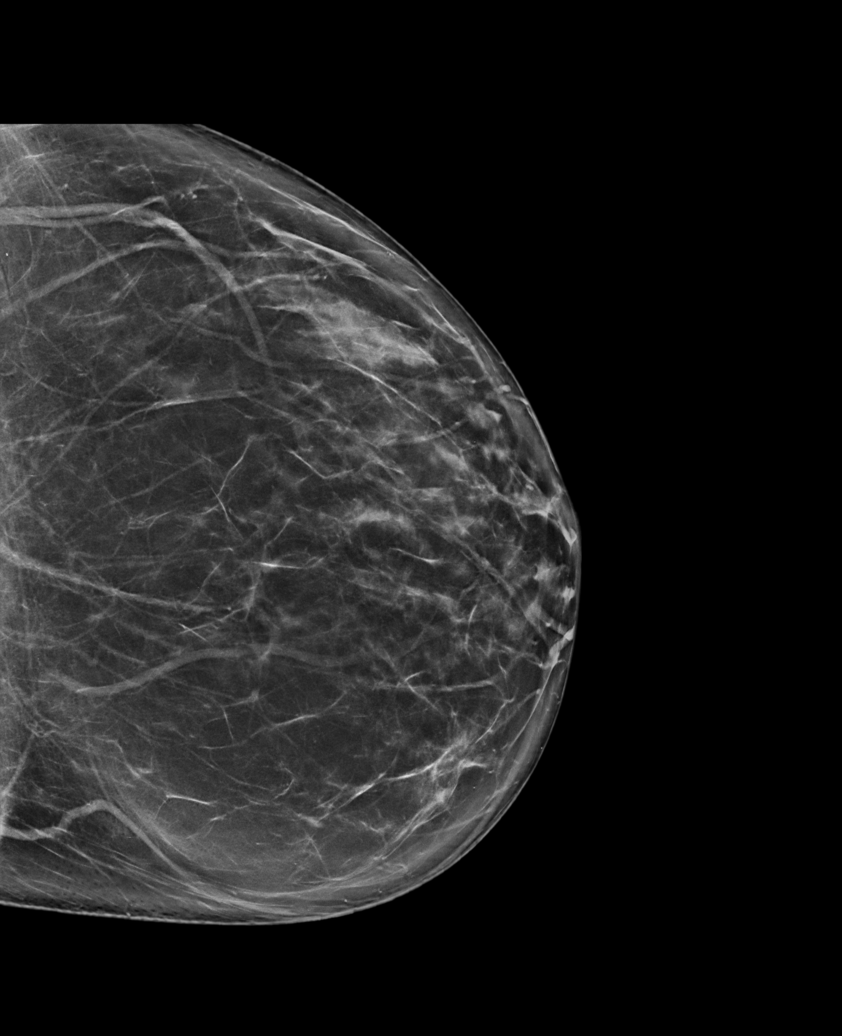

[L MLO synth-2D]
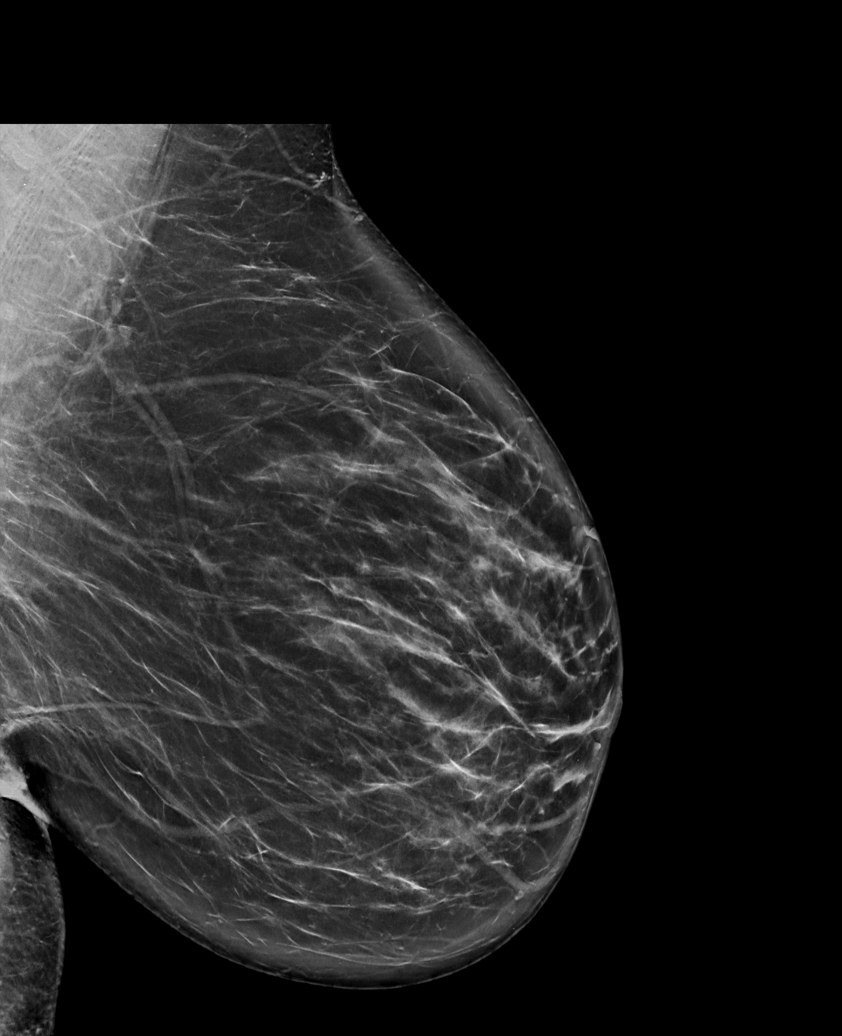

[R CC synth-2D (2 of 2)]
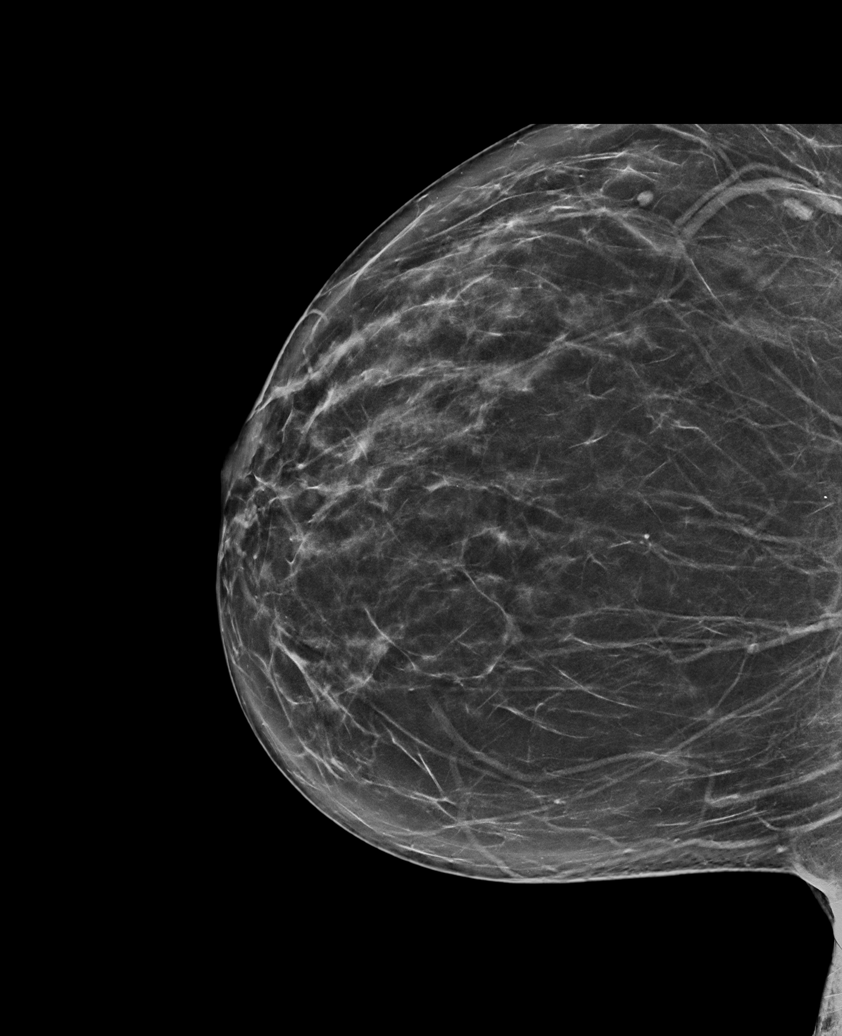

[L CC tomo · tomo slice 43/86.0]
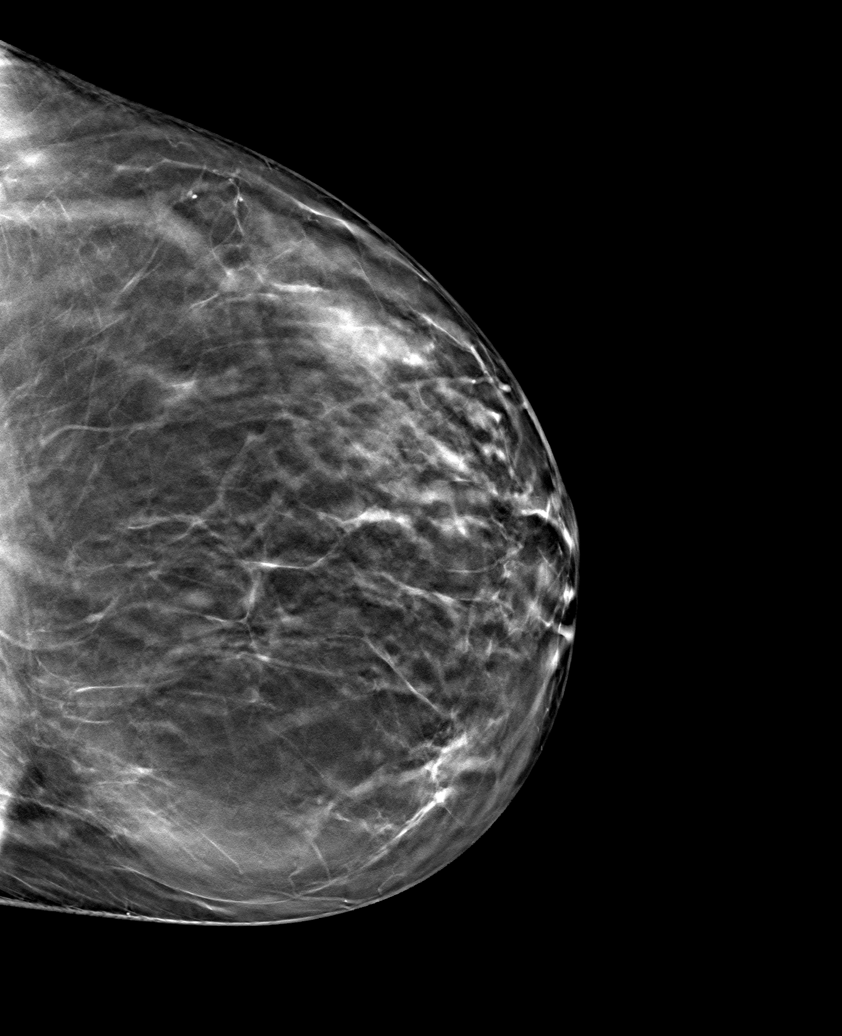

[6 of 30 positions shown; findings below may reference images not displayed]

ACR Breast Density Category b: There are scattered areas of
fibroglandular density.
FINDINGS: There are no findings suspicious for malignancy. The images were
evaluated with computer-aided detection.
IMPRESSION: No mammographic evidence of malignancy. A result letter of this
screening mammogram will be mailed directly to the patient.

RECOMMENDATION:
Screening mammogram in one year. (Code:WJ-[id number])

BI-RADS CATEGORY  1: Negative.
